# Patient Record
Sex: Male | Born: 1960 | Hispanic: No
Health system: Southern US, Community
[De-identification: ages and names within clinical notes are randomized; demographics above are authoritative.]

---

## 2008-04-14 ENCOUNTER — Ambulatory Visit: Payer: Self-pay | Admitting: Family Medicine

## 2008-04-14 ENCOUNTER — Emergency Department: Payer: Self-pay | Admitting: Emergency Medicine
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
## 2019-12-28 ENCOUNTER — Ambulatory Visit: Payer: Self-pay | Attending: Internal Medicine

## 2019-12-28 DIAGNOSIS — Z23 Encounter for immunization: Secondary | ICD-10-CM | POA: Insufficient documentation

## 2019-12-28 NOTE — Progress Notes (Signed)
   Covid[facility] Vaccination [facility]  Name:  Gregory Lee    MRN: [id number] DOB: [birth year]  12/28/2019  Gregory Lee was observed post Covid-19 immunization for 15 minutes without incident. He was provided with Vaccine Information Sheet and instruction to access the V-Safe system.   Gregory Lee was instructed to call 911 with any severe reactions post vaccine: Marland Kitchen Difficulty breathing  . Swelling of face and throat  . A fast heartbeat  . A bad rash all over body  . Dizziness and weakness

## 2020-01-03 ENCOUNTER — Ambulatory Visit: Payer: Self-pay

## 2020-01-29 ENCOUNTER — Ambulatory Visit: Payer: Self-pay | Attending: Internal Medicine

## 2020-01-29 DIAGNOSIS — Z23 Encounter for immunization: Secondary | ICD-10-CM

## 2020-01-29 NOTE — Progress Notes (Signed)
   Covid-19 Vaccination Clinic  Name:  Gregory Lee    MRN: [id number] DOB: [birth year]  01/29/2020  [name] was observed post Covid-19 immunization for 15 minutes without incident. He was provided with Vaccine Information Sheet and instruction to access the V-Safe system.   [name] was instructed to call 911 with any severe reactions post vaccine: Marland Kitchen Difficulty breathing  . Swelling of face and throat  . A fast heartbeat  . A bad rash all over body  . Dizziness and weakness   Immunizations Administered    Name Date Dose VIS Date Route   Pfizer COVID-19 Vaccine 01/29/2020  9:17 AM 0.3 mL 10/05/2019 Intramuscular   Manufacturer: [employer], [employer]   Lot: [id number]   NDC: [address]-5
